# Patient Record
Sex: Male | Born: 1985
Health system: Southern US, Community
[De-identification: ages and names within clinical notes are randomized; demographics above are authoritative.]

---
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
## 2009-02-14 ENCOUNTER — Inpatient Hospital Stay (HOSPITAL_COMMUNITY): Admission: EM | Admit: 2009-02-14 | Discharge: 2009-02-17 | Payer: Self-pay | Admitting: Emergency Medicine

## 2010-06-29 LAB — BLOOD GAS, ARTERIAL
O2 Content: 4 L/min
O2 Saturation: 96.1 %
Patient temperature: 98.6
pH, Arterial: 7.408 (ref 7.350–7.450)

## 2010-06-29 LAB — DIFFERENTIAL
Basophils Absolute: 0 10*3/uL (ref 0.0–0.1)
Basophils Relative: 0 % (ref 0–1)
Eosinophils Absolute: 0.9 10*3/uL — ABNORMAL HIGH (ref 0.0–0.7)
Eosinophils Relative: 10 % — ABNORMAL HIGH (ref 0–5)
Lymphocytes Relative: 29 % (ref 12–46)
Lymphs Abs: 2.8 10*3/uL (ref 0.7–4.0)
Monocytes Absolute: 0.8 10*3/uL (ref 0.1–1.0)
Monocytes Relative: 9 % (ref 3–12)
Neutro Abs: 5.1 10*3/uL (ref 1.7–7.7)
Neutrophils Relative %: 53 % (ref 43–77)

## 2010-06-29 LAB — CBC
HCT: 48.4 % (ref 39.0–52.0)
Hemoglobin: 15.5 g/dL (ref 13.0–17.0)
Hemoglobin: 16.8 g/dL (ref 13.0–17.0)
MCHC: 34.1 g/dL (ref 30.0–36.0)
MCHC: 34.6 g/dL (ref 30.0–36.0)
MCV: 92.7 fL (ref 78.0–100.0)
Platelets: 227 10*3/uL (ref 150–400)
Platelets: 229 10*3/uL (ref 150–400)
RBC: 5.22 MIL/uL (ref 4.22–5.81)
RDW: 13.6 % (ref 11.5–15.5)
RDW: 13.8 % (ref 11.5–15.5)
WBC: 9.7 10*3/uL (ref 4.0–10.5)

## 2010-06-29 LAB — BASIC METABOLIC PANEL
BUN: 18 mg/dL (ref 6–23)
CO2: 24 mEq/L (ref 19–32)
Calcium: 9.5 mg/dL (ref 8.4–10.5)
Chloride: 104 mEq/L (ref 96–112)
Creatinine, Ser: 0.92 mg/dL (ref 0.4–1.5)
GFR calc Af Amer: >60 mL/min (ref >60)
GFR calc non Af Amer: >60 mL/min (ref >60)
Glucose, Bld: 82 mg/dL (ref 70–99)
Potassium: 3.9 mEq/L (ref 3.5–5.1)
Sodium: 139 mEq/L (ref 135–145)

## 2014-12-29 ENCOUNTER — Emergency Department (HOSPITAL_BASED_OUTPATIENT_CLINIC_OR_DEPARTMENT_OTHER)
Admission: EM | Admit: 2014-12-29 | Discharge: 2014-12-29 | Disposition: A | Discharge status: Home / Self Care | Payer: Worker's Compensation | Attending: Emergency Medicine | Admitting: Emergency Medicine

## 2014-12-29 ENCOUNTER — Encounter (HOSPITAL_BASED_OUTPATIENT_CLINIC_OR_DEPARTMENT_OTHER): Payer: Self-pay | Admitting: Emergency Medicine

## 2014-12-29 DIAGNOSIS — X58XXXA Exposure to other specified factors, initial encounter: Secondary | ICD-10-CM | POA: Insufficient documentation

## 2014-12-29 DIAGNOSIS — Y9389 Activity, other specified: Secondary | ICD-10-CM | POA: Diagnosis not present

## 2014-12-29 DIAGNOSIS — Y990 Civilian activity done for income or pay: Secondary | ICD-10-CM | POA: Diagnosis not present

## 2014-12-29 DIAGNOSIS — Y9289 Other specified places as the place of occurrence of the external cause: Secondary | ICD-10-CM | POA: Diagnosis not present

## 2014-12-29 DIAGNOSIS — T2601XA Burn of right eyelid and periocular area, initial encounter: Secondary | ICD-10-CM | POA: Insufficient documentation

## 2014-12-29 DIAGNOSIS — T2641XA Burn of right eye and adnexa, part unspecified, initial encounter: Secondary | ICD-10-CM

## 2014-12-29 MED ORDER — TETRACAINE HCL 0.5 % OP SOLN
1.0000 [drp] | Freq: Once | OPHTHALMIC | Status: AC
  Administered 2014-12-29: 1 [drp] via OPHTHALMIC

## 2014-12-29 MED ORDER — FLUORESCEIN SODIUM 1 MG OP STRP
ORAL_STRIP | OPHTHALMIC | Status: AC
  Administered 2014-12-29: 1 via OPHTHALMIC
  Filled 2014-12-29: qty 1

## 2014-12-29 MED ORDER — FLUORESCEIN SODIUM 1 MG OP STRP
1.0000 | ORAL_STRIP | Freq: Once | OPHTHALMIC | Status: AC
  Administered 2014-12-29: 1 via OPHTHALMIC

## 2014-12-29 MED ORDER — TETRACAINE HCL 0.5 % OP SOLN
OPHTHALMIC | Status: AC
  Administered 2014-12-29: 1 [drp] via OPHTHALMIC
  Filled 2014-12-29: qty 2

## 2014-12-29 NOTE — Discharge Instructions (Signed)

## 2014-12-29 NOTE — ED Provider Notes (Signed)
CSN: [id number]     Arrival date & time 12/29/14  2009 History  By signing my name below, I, Octavia Heir, attest that this documentation has been prepared under the direction and in the presence of Tilden Fossa, MD. Electronically Signed: Octavia Heir, ED Scribe. 12/29/2014. 9:12 PM.     Chief Complaint  Patient presents with  . Eye Injury      The history is provided by the patient. No language interpreter was used.   HPI Comments: Thomas Blevins is a 29 y.o. male who presents to the Emergency Department complaining of a sudden onset, constant, gradual worsening right eye injury that occurred 2 hours ago. 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 He notes the machine gets up to 6000 degrees. He notes washing his eye out with water after the incident occurred. Pt states he began to have a film over his eye as the hot solution was drying on his eye and he notes that his eye began to bleed. Pt reports putting some ointment on his eye to help with the pain. No visual changes.  History reviewed. No pertinent past medical history. History reviewed. No pertinent past surgical history. History reviewed. No pertinent family history. Social History  Substance Use Topics  . Smoking status: Never Smoker   . Smokeless tobacco: None  . Alcohol Use: Yes     Comment: occ    Review of Systems  Eyes: Positive for pain, discharge and redness.  All other systems reviewed and are negative.     Allergies  Review of patient's allergies indicates no known allergies.  Home Medications   Prior to Admission medications   Not on File   Triage vitals: BP 156/67 mmHg  Pulse 75  Temp(Src) 98.9 F (37.2 C) (Oral)  Resp 16  Ht  (1.854 m)  Wt 175 lb (79.379 kg)  BMI 23.09 kg/m2  SpO2 100% Physical Exam  Constitutional: He is oriented to person, place, and time. He appears well-developed and well-nourished.  HENT:  Head:  Normocephalic and atraumatic.  Eyes:  PERRL, EOMI.  Mild conjunctival injection on the right.  No uptake on fluorosceine staining of the right.  Symptomatic improvement in eye pain after tetracaine drops.  Right lateral periorbital region with superficial partial thickness burns on superior and inferior portions.    Cardiovascular: Normal rate.   Pulmonary/Chest: Effort normal. No respiratory distress.  Musculoskeletal: He exhibits no edema or tenderness.  Neurological: He is alert and oriented to person, place, and time.  Skin: Skin is warm and dry.  Psychiatric: He has a normal mood and affect. His behavior is normal.  Nursing note and vitals reviewed.   ED Course  Procedures  DIAGNOSTIC STUDIES: Oxygen Saturation is 100% on RA, normal by my interpretation.  COORDINATION OF CARE:  9:12 PM Discussed treatment plan which includes irrigate the right eye with pt at bedside and pt agreed to plan.  Labs Review Labs Reviewed - No data to display  Imaging Review No results found. I have personally reviewed and evaluated these images and lab results as part of my medical decision-making.   EKG Interpretation None      MDM   Final diagnoses:  Partial thickness burn of eye, right, initial encounter   Patient here for evaluation of injury following getting hit in the right eye with hot polyester thread from an extruding machine. He has a partial-thickness burn of the  periorbital region. There is no clear evidence of corneal injury. PH is 7 and I was irrigated in the emergency department. Pain improved after eyedrops. Discussed with patient ophthalmology follow-up for recheck tomorrow, home care for superficial partial-thickness burns with bacitracin ointment, return precautions.  I personally performed the services described in this documentation, which was scribed in my presence. The recorded information has been reviewed and is accurate.   Tilden Fossa, MD 12/30/14 [phone]

## 2014-12-29 NOTE — ED Notes (Signed)
Patient states that a chemical that he works with splashed into his right eye and he had some bleeding. Rinsed eyes well afterwards but would like to have it checked

## 2016-09-26 DIAGNOSIS — R5383 Other fatigue: Secondary | ICD-10-CM | POA: Diagnosis not present

## 2016-09-26 DIAGNOSIS — M791 Myalgia: Secondary | ICD-10-CM | POA: Diagnosis not present

## 2016-09-26 DIAGNOSIS — R42 Dizziness and giddiness: Secondary | ICD-10-CM | POA: Diagnosis not present

## 2016-09-26 DIAGNOSIS — R140 Abdominal distension (gaseous): Secondary | ICD-10-CM | POA: Diagnosis not present

## 2016-11-08 DIAGNOSIS — H5213 Myopia, bilateral: Secondary | ICD-10-CM | POA: Diagnosis not present

## 2017-06-18 DIAGNOSIS — Z113 Encounter for screening for infections with a predominantly sexual mode of transmission: Secondary | ICD-10-CM | POA: Diagnosis not present

## 2017-06-18 DIAGNOSIS — F419 Anxiety disorder, unspecified: Secondary | ICD-10-CM | POA: Diagnosis not present

## 2017-10-05 DIAGNOSIS — R748 Abnormal levels of other serum enzymes: Secondary | ICD-10-CM | POA: Diagnosis not present

## 2017-10-05 DIAGNOSIS — F419 Anxiety disorder, unspecified: Secondary | ICD-10-CM | POA: Diagnosis not present

## 2017-10-05 DIAGNOSIS — R5383 Other fatigue: Secondary | ICD-10-CM | POA: Diagnosis not present

## 2018-06-27 DIAGNOSIS — M549 Dorsalgia, unspecified: Secondary | ICD-10-CM | POA: Diagnosis not present

## 2018-06-27 DIAGNOSIS — Z0000 Encounter for general adult medical examination without abnormal findings: Secondary | ICD-10-CM | POA: Diagnosis not present

## 2018-06-27 DIAGNOSIS — F411 Generalized anxiety disorder: Secondary | ICD-10-CM | POA: Diagnosis not present

## 2018-06-27 DIAGNOSIS — Z8249 Family history of ischemic heart disease and other diseases of the circulatory system: Secondary | ICD-10-CM | POA: Diagnosis not present

## 2018-08-06 DIAGNOSIS — Z113 Encounter for screening for infections with a predominantly sexual mode of transmission: Secondary | ICD-10-CM | POA: Diagnosis not present

## 2018-08-20 DIAGNOSIS — J4541 Moderate persistent asthma with (acute) exacerbation: Secondary | ICD-10-CM | POA: Diagnosis not present

## 2018-08-20 DIAGNOSIS — Z20828 Contact with and (suspected) exposure to other viral communicable diseases: Secondary | ICD-10-CM | POA: Diagnosis not present

## 2018-09-05 DIAGNOSIS — J4521 Mild intermittent asthma with (acute) exacerbation: Secondary | ICD-10-CM | POA: Diagnosis not present

## 2018-10-25 ENCOUNTER — Ambulatory Visit
Admission: RE | Admit: 2018-10-25 | Discharge: 2018-10-25 | Disposition: A | Discharge status: Home / Self Care | Payer: Self-pay | Source: Ambulatory Visit | Attending: Family Medicine | Admitting: Family Medicine

## 2018-10-25 ENCOUNTER — Other Ambulatory Visit: Payer: Self-pay

## 2018-10-25 ENCOUNTER — Other Ambulatory Visit: Payer: Self-pay | Admitting: Family Medicine

## 2018-10-25 DIAGNOSIS — R0789 Other chest pain: Secondary | ICD-10-CM | POA: Diagnosis not present

## 2018-10-25 DIAGNOSIS — R091 Pleurisy: Secondary | ICD-10-CM

## 2019-02-17 DIAGNOSIS — Z20828 Contact with and (suspected) exposure to other viral communicable diseases: Secondary | ICD-10-CM | POA: Diagnosis not present

## 2019-02-17 DIAGNOSIS — Z9189 Other specified personal risk factors, not elsewhere classified: Secondary | ICD-10-CM | POA: Diagnosis not present

## 2019-06-30 DIAGNOSIS — Z0000 Encounter for general adult medical examination without abnormal findings: Secondary | ICD-10-CM | POA: Diagnosis not present

## 2019-06-30 DIAGNOSIS — N410 Acute prostatitis: Secondary | ICD-10-CM | POA: Diagnosis not present

## 2019-06-30 DIAGNOSIS — J4520 Mild intermittent asthma, uncomplicated: Secondary | ICD-10-CM | POA: Diagnosis not present

## 2019-06-30 DIAGNOSIS — Z83438 Family history of other disorder of lipoprotein metabolism and other lipidemia: Secondary | ICD-10-CM | POA: Diagnosis not present

## 2020-01-05 DIAGNOSIS — J4521 Mild intermittent asthma with (acute) exacerbation: Secondary | ICD-10-CM | POA: Diagnosis not present

## 2020-01-05 DIAGNOSIS — U071 COVID-19: Secondary | ICD-10-CM | POA: Diagnosis not present

## 2020-02-26 DIAGNOSIS — M4726 Other spondylosis with radiculopathy, lumbar region: Secondary | ICD-10-CM | POA: Diagnosis not present

## 2020-02-26 DIAGNOSIS — M5431 Sciatica, right side: Secondary | ICD-10-CM | POA: Diagnosis not present

## 2020-02-26 DIAGNOSIS — M545 Low back pain, unspecified: Secondary | ICD-10-CM | POA: Diagnosis not present

## 2020-03-17 DIAGNOSIS — Z113 Encounter for screening for infections with a predominantly sexual mode of transmission: Secondary | ICD-10-CM | POA: Diagnosis not present

## 2020-03-17 DIAGNOSIS — N5089 Other specified disorders of the male genital organs: Secondary | ICD-10-CM | POA: Diagnosis not present

## 2020-06-30 DIAGNOSIS — F411 Generalized anxiety disorder: Secondary | ICD-10-CM | POA: Diagnosis not present

## 2020-06-30 DIAGNOSIS — Z0000 Encounter for general adult medical examination without abnormal findings: Secondary | ICD-10-CM | POA: Diagnosis not present

## 2020-06-30 DIAGNOSIS — R5383 Other fatigue: Secondary | ICD-10-CM | POA: Diagnosis not present

## 2020-09-24 DIAGNOSIS — R6882 Decreased libido: Secondary | ICD-10-CM | POA: Diagnosis not present

## 2020-11-23 DIAGNOSIS — M79671 Pain in right foot: Secondary | ICD-10-CM | POA: Diagnosis not present

## 2020-11-23 DIAGNOSIS — S91321A Laceration with foreign body, right foot, initial encounter: Secondary | ICD-10-CM | POA: Diagnosis not present

## 2020-11-23 DIAGNOSIS — Y92832 Beach as the place of occurrence of the external cause: Secondary | ICD-10-CM | POA: Diagnosis not present

## 2020-11-23 DIAGNOSIS — W228XXA Striking against or struck by other objects, initial encounter: Secondary | ICD-10-CM | POA: Diagnosis not present

## 2020-12-28 ENCOUNTER — Other Ambulatory Visit: Payer: Self-pay

## 2020-12-28 ENCOUNTER — Ambulatory Visit (INDEPENDENT_AMBULATORY_CARE_PROVIDER_SITE_OTHER): Payer: BC Managed Care – PPO | Admitting: Endocrinology

## 2020-12-28 DIAGNOSIS — R5383 Other fatigue: Secondary | ICD-10-CM | POA: Diagnosis not present

## 2020-12-28 LAB — T3, FREE: T3, Free: 3.4 pg/mL (ref 2.3–4.2)

## 2020-12-28 LAB — CORTISOL: Cortisol, Plasma: 13 ug/dL

## 2020-12-28 NOTE — Progress Notes (Signed)
Subjective:    Patient ID: Thomas Blevins, male    DOB: 05/21/1985, 35 y.o.   MRN: [id number]  HPI Pt is referred by Dr Tiburcio Pea, for several sxs.  He reports 3 years of fatigue, low libido, dry skin, and difficulty with concentration.  Sxs are worse sine covid infection earlier in 2022.   no h/o abdominal or brain injury.  No h/o cancer, thyroid problems, seizures, hypoglycemia, amyloidosis, tuberculosis, or diabetes.  No recent steroids.  No h/o ketoconazole, rifampin, or dilantin.  He has never had adrenal imaging.  We have run out of diluent for cosyntropin.  Pt reports he had puberty at the normal age.  He has no biological children.  He says he has never taken illicit androgens.  He has never had pituitary imaging. He has never been on any prescribed medication for hypogonadism.  He does not take antiandrogens or opioids.  He denies any h/o infertility, XRT.Marland Kitchen  He has never had surgery, or a serious injury to the head or genital area. He has no h/o or DVT.   He does not consume alcohol excessively.    Social History   Socioeconomic History   Marital status: Married    Spouse name: Not on file   Number of children: Not on file   Years of education: Not on file   Highest education level: Not on file  Occupational History   Not on file  Tobacco Use   Smoking status: Never   Smokeless tobacco: Not on file  Substance and Sexual Activity   Alcohol use: Yes    Comment: occ   Drug use: No   Sexual activity: Not on file  Other Topics Concern   Not on file  Social History Narrative   Not on file   Social Determinants of Health   Financial Resource Strain: Not on file  Food Insecurity: Not on file  Transportation Needs: Not on file  Physical Activity: Not on file  Stress: Not on file  Social Connections: Not on file  Intimate Partner Violence: Not on file    No current outpatient medications on file prior to visit.   No current facility-administered medications on file  prior to visit.    No Known Allergies  No family history on file.  BP 130/70 (BP Location: Right Arm, Patient Position: Sitting, Cuff Size: Large)   Pulse 72   Ht 6\' 1"  (1.854 m)   Wt 201 lb 3.2 oz (91.3 kg)   SpO2 98%   BMI 26.55 kg/m    Review of Systems Denies weight loss, dizziness, n/v, cold intolerance, vitiligo, and change in skin tone.      Objective:   Physical Exam VS: see vs page GEN: no distress HEAD: head: no deformity eyes: no periorbital swelling, no proptosis external nose and ears are normal NECK: supple, thyroid is not enlarged CHEST WALL: no deformity LUNGS: clear to auscultation BREASTS:  No gynecomastia CV: reg rate and rhythm, soft syst murmur (pt says chronic) GENITALIA:  Normal male.   MUSCULOSKELETAL: muscle bulk and strength are grossly normal.  no joint swelling is seen  gait is normal and steady.  EXTEMITIES: no leg edema NEURO: sensation is intact to touch on all 4's SKIN:  Normal texture and temperature.  No rash or suspicious lesion is visible.  Normal male hair distribution. NODES:  None palpable at the neck PSYCH: alert, well-oriented.  Does not appear anxious nor depressed.    outside test results are reviewed: Testosterone=427  TSH=1.4 Cortisol=17  I have reviewed outside records, and summarized: Pt was noted to have fatigue and other sxs, and referred here.  Seborrhea, asthma, and anxiety were also addressed     Assessment & Plan:  Fatigue and other sxs, new to me, uncertain etiology and prognosis.  I told pt no medication is needed for testosterone  Patient Instructions  Blood tests are requested for you today.  We'll let you know about the results.

## 2020-12-28 NOTE — Patient Instructions (Signed)
Blood tests are requested for you today.  We'll let you know about the results.  

## 2020-12-29 LAB — VITAMIN B12: Vitamin B-12: 546 pg/mL (ref 211–911)

## 2020-12-29 LAB — T4, FREE: Free T4: 0.87 ng/dL (ref 0.60–1.60)

## 2021-01-02 LAB — CATECHOLAMINES, FRACTIONATED, PLASMA
Dopamine: <10 pg/mL
Epinephrine: 38 pg/mL
Norepinephrine: 333 pg/mL
Total Catecholamines: 371 pg/mL

## 2021-01-02 LAB — ACTH: C206 ACTH: 42 pg/mL (ref 6–50)

## 2021-01-02 LAB — METANEPHRINES, PLASMA
Metanephrine, Free: 34 pg/mL (ref <=57)
Normetanephrine, Free: 64 pg/mL (ref <=148)
Total Metanephrines-Plasma: 98 pg/mL (ref <=205)

## 2021-03-30 DIAGNOSIS — E291 Testicular hypofunction: Secondary | ICD-10-CM | POA: Diagnosis not present

## 2021-07-11 DIAGNOSIS — Z0000 Encounter for general adult medical examination without abnormal findings: Secondary | ICD-10-CM | POA: Diagnosis not present

## 2021-07-11 DIAGNOSIS — Z23 Encounter for immunization: Secondary | ICD-10-CM | POA: Diagnosis not present

## 2021-07-11 DIAGNOSIS — R0789 Other chest pain: Secondary | ICD-10-CM | POA: Diagnosis not present

## 2021-07-29 DIAGNOSIS — J4521 Mild intermittent asthma with (acute) exacerbation: Secondary | ICD-10-CM | POA: Diagnosis not present

## 2021-08-18 ENCOUNTER — Ambulatory Visit
Admission: RE | Admit: 2021-08-18 | Discharge: 2021-08-18 | Disposition: A | Discharge status: Home / Self Care | Payer: BC Managed Care – PPO | Source: Ambulatory Visit | Attending: Family Medicine | Admitting: Family Medicine

## 2021-08-18 ENCOUNTER — Other Ambulatory Visit: Payer: Self-pay | Admitting: Family Medicine

## 2021-08-18 DIAGNOSIS — R079 Chest pain, unspecified: Secondary | ICD-10-CM | POA: Diagnosis not present

## 2021-08-18 DIAGNOSIS — R0789 Other chest pain: Secondary | ICD-10-CM

## 2022-03-09 DIAGNOSIS — J4521 Mild intermittent asthma with (acute) exacerbation: Secondary | ICD-10-CM | POA: Diagnosis not present

## 2022-03-16 ENCOUNTER — Ambulatory Visit: Payer: Self-pay | Admitting: Podiatry

## 2022-03-16 DIAGNOSIS — M778 Other enthesopathies, not elsewhere classified: Secondary | ICD-10-CM

## 2022-04-27 ENCOUNTER — Ambulatory Visit: Payer: Self-pay | Admitting: Podiatry

## 2022-05-02 DIAGNOSIS — J45901 Unspecified asthma with (acute) exacerbation: Secondary | ICD-10-CM | POA: Diagnosis not present

## 2022-05-02 DIAGNOSIS — G4700 Insomnia, unspecified: Secondary | ICD-10-CM | POA: Diagnosis not present

## 2022-05-02 DIAGNOSIS — Z113 Encounter for screening for infections with a predominantly sexual mode of transmission: Secondary | ICD-10-CM | POA: Diagnosis not present

## 2022-05-05 DIAGNOSIS — F4321 Adjustment disorder with depressed mood: Secondary | ICD-10-CM | POA: Diagnosis not present

## 2022-06-06 DIAGNOSIS — F4321 Adjustment disorder with depressed mood: Secondary | ICD-10-CM | POA: Diagnosis not present

## 2022-06-20 DIAGNOSIS — R059 Cough, unspecified: Secondary | ICD-10-CM | POA: Diagnosis not present

## 2022-06-20 DIAGNOSIS — R0602 Shortness of breath: Secondary | ICD-10-CM | POA: Diagnosis not present

## 2022-07-04 DIAGNOSIS — F4321 Adjustment disorder with depressed mood: Secondary | ICD-10-CM | POA: Diagnosis not present

## 2022-07-14 DIAGNOSIS — F419 Anxiety disorder, unspecified: Secondary | ICD-10-CM | POA: Diagnosis not present

## 2022-07-14 DIAGNOSIS — J4520 Mild intermittent asthma, uncomplicated: Secondary | ICD-10-CM | POA: Diagnosis not present

## 2022-07-14 DIAGNOSIS — Z0000 Encounter for general adult medical examination without abnormal findings: Secondary | ICD-10-CM | POA: Diagnosis not present

## 2022-07-14 DIAGNOSIS — Z13220 Encounter for screening for lipoid disorders: Secondary | ICD-10-CM | POA: Diagnosis not present

## 2022-07-14 DIAGNOSIS — F5101 Primary insomnia: Secondary | ICD-10-CM | POA: Diagnosis not present

## 2022-07-24 DIAGNOSIS — F4321 Adjustment disorder with depressed mood: Secondary | ICD-10-CM | POA: Diagnosis not present

## 2022-08-25 DIAGNOSIS — R109 Unspecified abdominal pain: Secondary | ICD-10-CM | POA: Diagnosis not present

## 2022-08-25 DIAGNOSIS — R197 Diarrhea, unspecified: Secondary | ICD-10-CM | POA: Diagnosis not present

## 2022-10-03 DIAGNOSIS — J4521 Mild intermittent asthma with (acute) exacerbation: Secondary | ICD-10-CM | POA: Diagnosis not present

## 2022-10-03 DIAGNOSIS — B079 Viral wart, unspecified: Secondary | ICD-10-CM | POA: Diagnosis not present

## 2022-10-18 IMAGING — CR DG CHEST 2V
3 series · 3 of 3 positions shown · non-contrast
Comparison: Chest x-ray 10/25/2018

CLINICAL DATA: Chest pain

EXAM:
CHEST - 2 VIEW

[w chest pa]
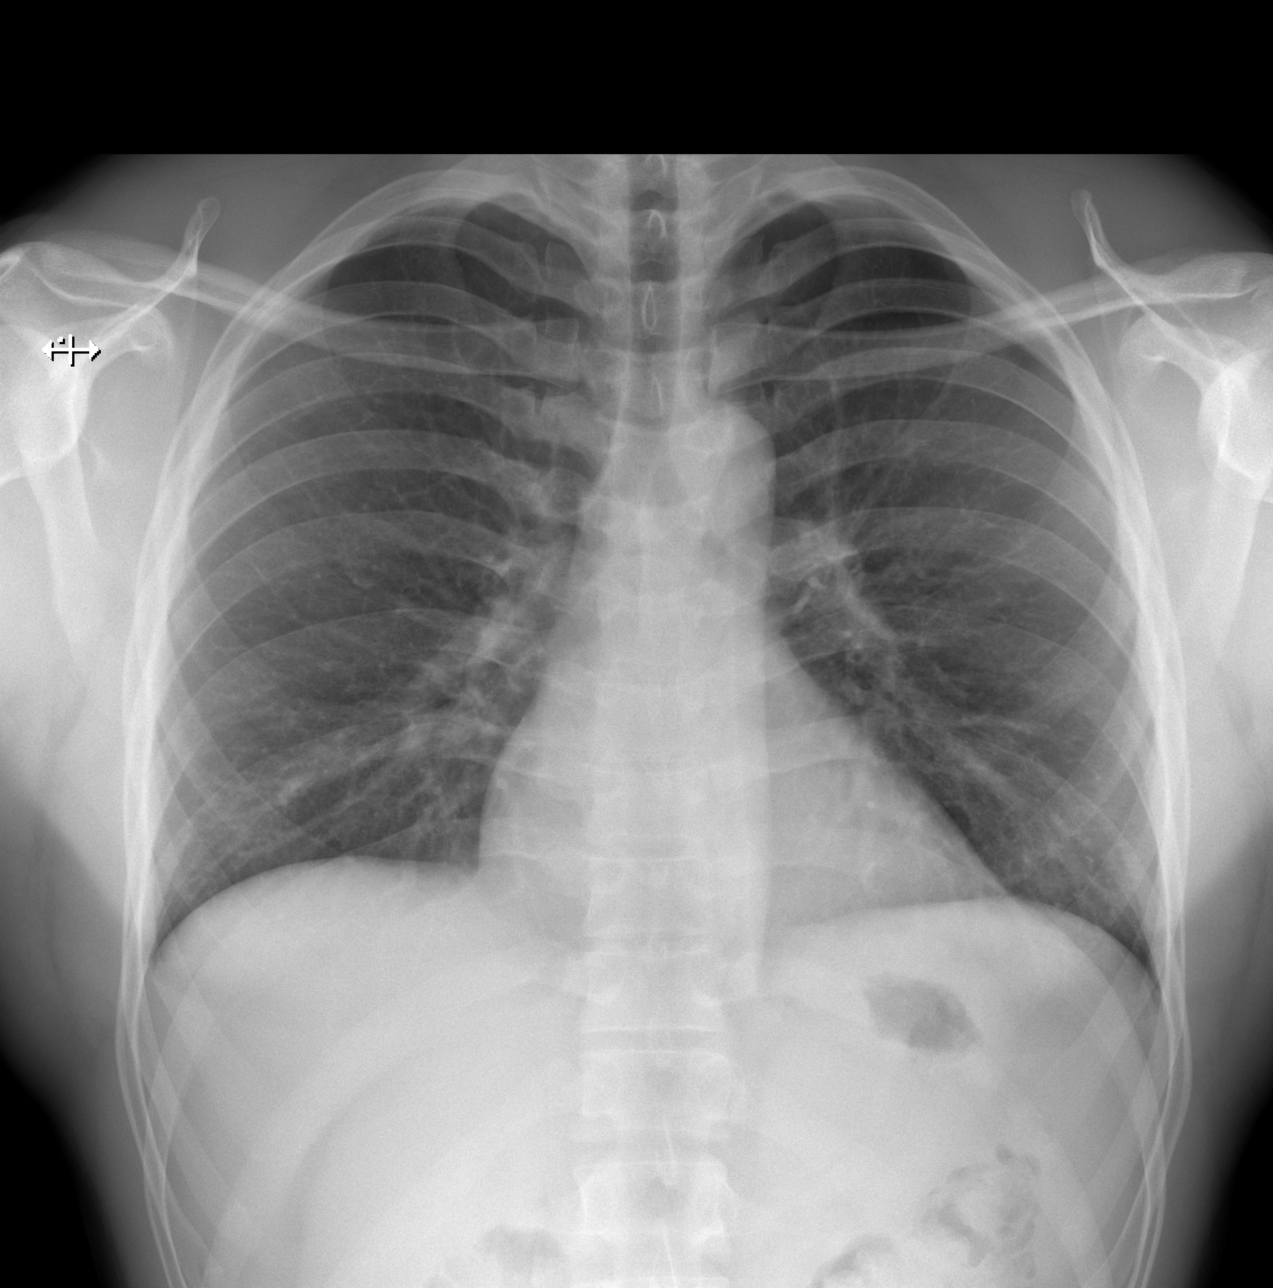

[w chest lat (1 of 2)]
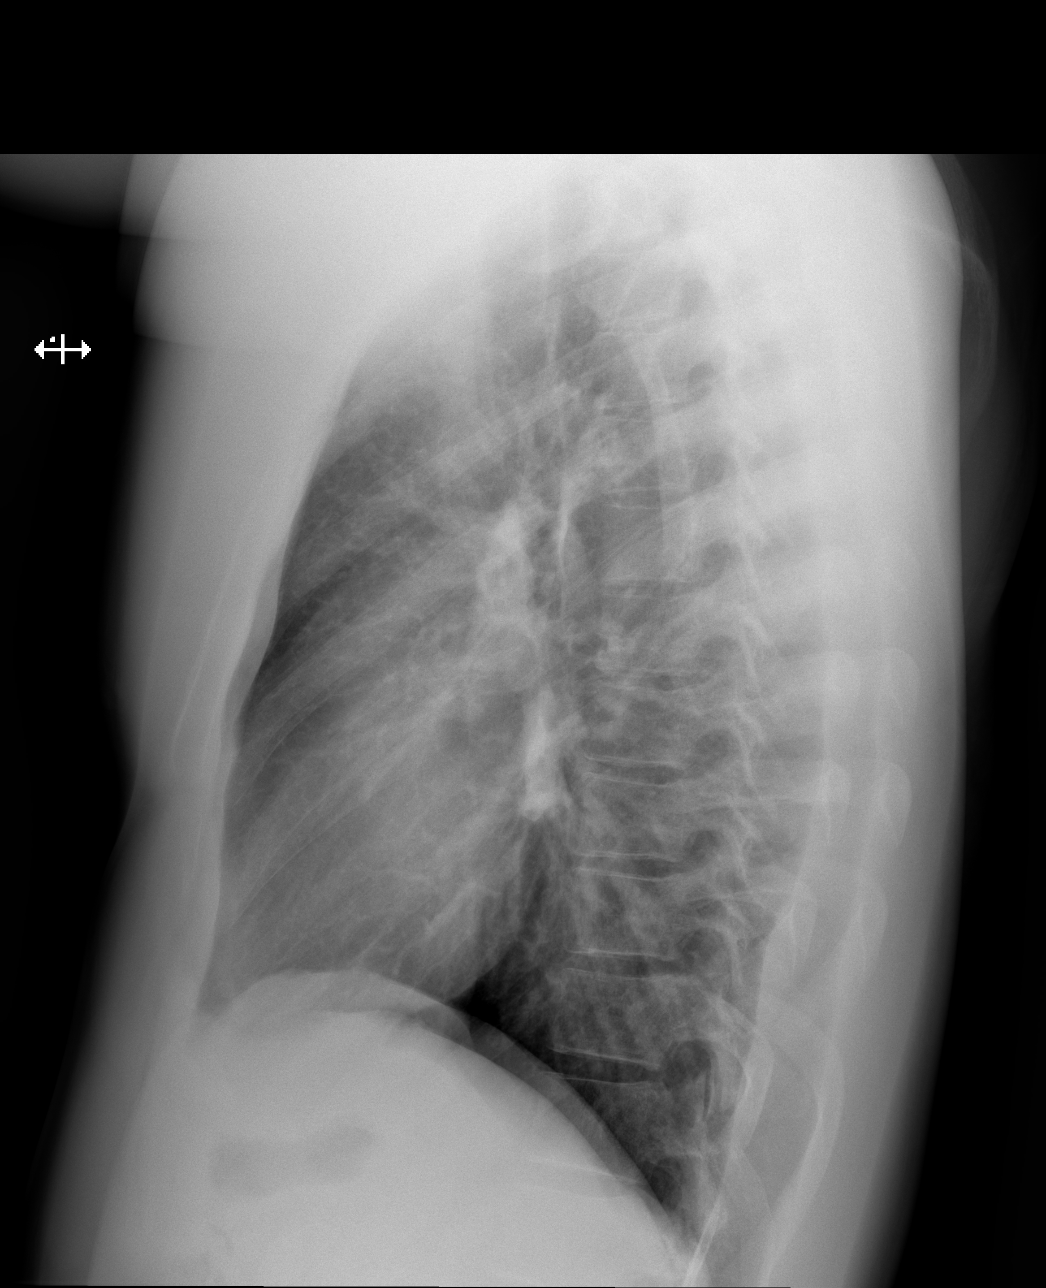

[w chest lat (2 of 2)]
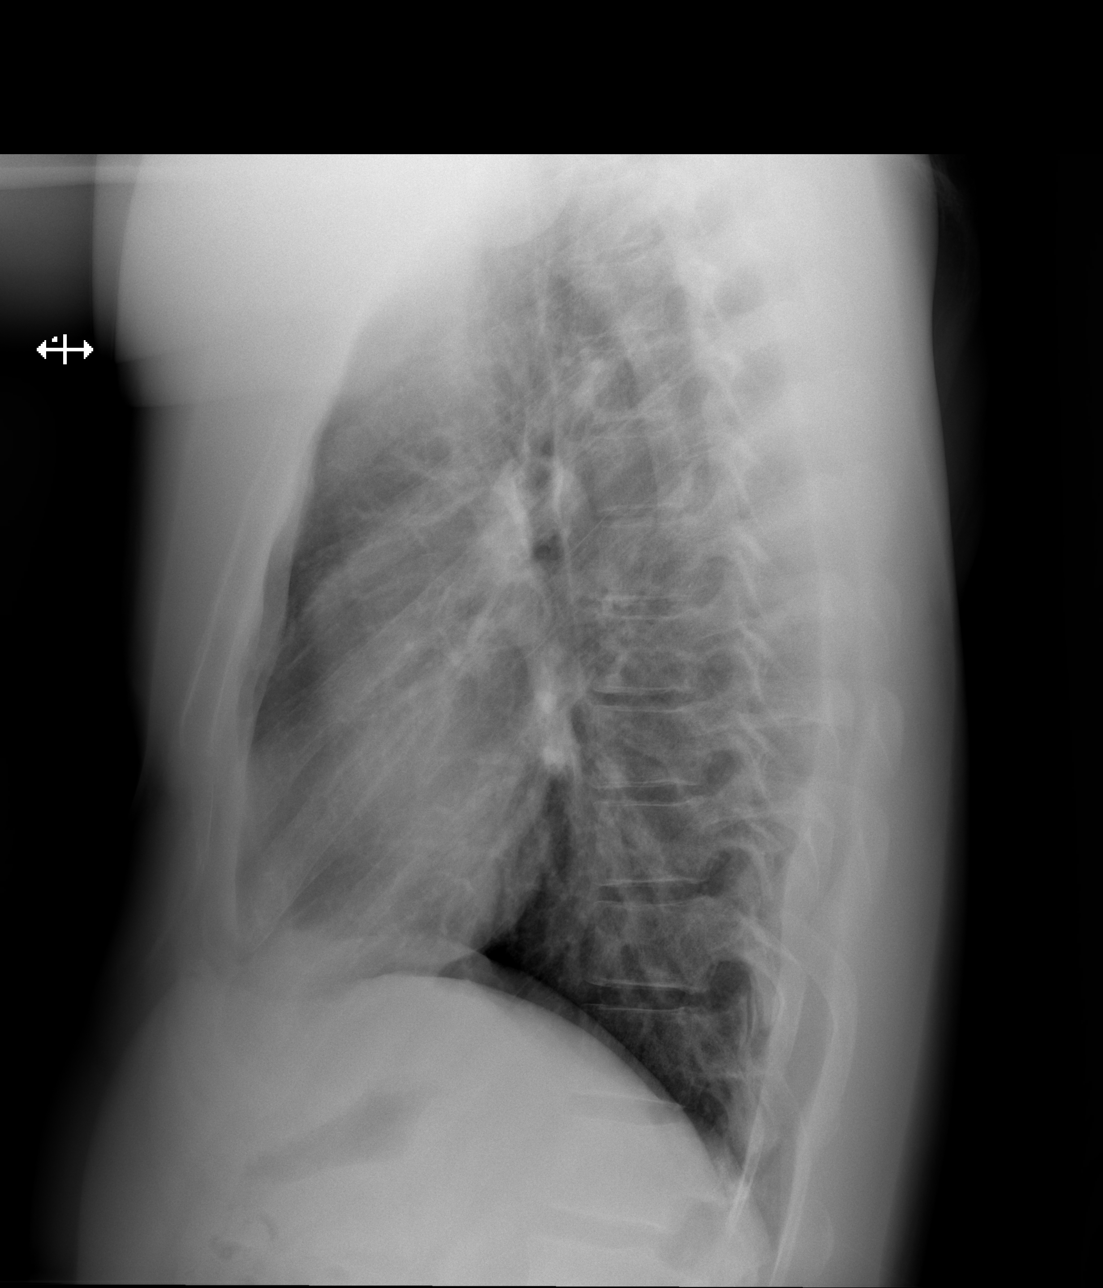

[3 of 3 positions shown; findings below may reference images not displayed]

FINDINGS: Heart size and mediastinal contours are within normal limits. No
suspicious pulmonary opacities identified.

No pleural effusion or pneumothorax visualized.

No acute osseous abnormality appreciated.
IMPRESSION: No acute intrathoracic process identified.

## 2022-12-11 DIAGNOSIS — J4521 Mild intermittent asthma with (acute) exacerbation: Secondary | ICD-10-CM | POA: Diagnosis not present

## 2023-01-24 DIAGNOSIS — Z840 Family history of diseases of the skin and subcutaneous tissue: Secondary | ICD-10-CM | POA: Diagnosis not present

## 2023-01-24 DIAGNOSIS — R519 Headache, unspecified: Secondary | ICD-10-CM | POA: Diagnosis not present

## 2023-01-24 DIAGNOSIS — F411 Generalized anxiety disorder: Secondary | ICD-10-CM | POA: Diagnosis not present

## 2023-01-24 DIAGNOSIS — H539 Unspecified visual disturbance: Secondary | ICD-10-CM | POA: Diagnosis not present

## 2023-01-29 ENCOUNTER — Other Ambulatory Visit: Payer: Self-pay | Admitting: Family Medicine

## 2023-01-29 DIAGNOSIS — R4189 Other symptoms and signs involving cognitive functions and awareness: Secondary | ICD-10-CM

## 2023-01-29 DIAGNOSIS — H539 Unspecified visual disturbance: Secondary | ICD-10-CM

## 2023-01-29 DIAGNOSIS — R519 Headache, unspecified: Secondary | ICD-10-CM

## 2023-02-08 DIAGNOSIS — Z23 Encounter for immunization: Secondary | ICD-10-CM | POA: Diagnosis not present

## 2023-02-12 ENCOUNTER — Encounter: Payer: Self-pay | Admitting: Family Medicine

## 2023-02-17 ENCOUNTER — Ambulatory Visit
Admission: RE | Admit: 2023-02-17 | Discharge: 2023-02-17 | Disposition: A | Discharge status: Home / Self Care | Payer: BC Managed Care – PPO | Source: Ambulatory Visit | Attending: Family Medicine | Admitting: Family Medicine

## 2023-02-17 DIAGNOSIS — R4701 Aphasia: Secondary | ICD-10-CM | POA: Diagnosis not present

## 2023-02-17 DIAGNOSIS — R41840 Attention and concentration deficit: Secondary | ICD-10-CM | POA: Diagnosis not present

## 2023-02-17 DIAGNOSIS — R519 Headache, unspecified: Secondary | ICD-10-CM | POA: Diagnosis not present

## 2023-02-17 DIAGNOSIS — H539 Unspecified visual disturbance: Secondary | ICD-10-CM

## 2023-02-17 DIAGNOSIS — R5383 Other fatigue: Secondary | ICD-10-CM | POA: Diagnosis not present

## 2023-02-17 DIAGNOSIS — R4189 Other symptoms and signs involving cognitive functions and awareness: Secondary | ICD-10-CM

## 2023-06-11 DIAGNOSIS — M79641 Pain in right hand: Secondary | ICD-10-CM | POA: Diagnosis not present

## 2023-06-11 DIAGNOSIS — S62231A Other displaced fracture of base of first metacarpal bone, right hand, initial encounter for closed fracture: Secondary | ICD-10-CM | POA: Diagnosis not present

## 2023-06-11 DIAGNOSIS — S63637A Sprain of interphalangeal joint of left little finger, initial encounter: Secondary | ICD-10-CM | POA: Diagnosis not present

## 2023-06-11 DIAGNOSIS — M79645 Pain in left finger(s): Secondary | ICD-10-CM | POA: Diagnosis not present

## 2023-06-12 DIAGNOSIS — S62231A Other displaced fracture of base of first metacarpal bone, right hand, initial encounter for closed fracture: Secondary | ICD-10-CM | POA: Diagnosis not present

## 2023-06-22 DIAGNOSIS — S62231D Other displaced fracture of base of first metacarpal bone, right hand, subsequent encounter for fracture with routine healing: Secondary | ICD-10-CM | POA: Diagnosis not present

## 2023-07-06 DIAGNOSIS — S62231D Other displaced fracture of base of first metacarpal bone, right hand, subsequent encounter for fracture with routine healing: Secondary | ICD-10-CM | POA: Diagnosis not present

## 2023-07-20 DIAGNOSIS — Z0000 Encounter for general adult medical examination without abnormal findings: Secondary | ICD-10-CM | POA: Diagnosis not present

## 2023-07-20 DIAGNOSIS — R739 Hyperglycemia, unspecified: Secondary | ICD-10-CM | POA: Diagnosis not present

## 2023-07-20 DIAGNOSIS — J4520 Mild intermittent asthma, uncomplicated: Secondary | ICD-10-CM | POA: Diagnosis not present

## 2023-07-20 DIAGNOSIS — R5383 Other fatigue: Secondary | ICD-10-CM | POA: Diagnosis not present

## 2023-07-24 DIAGNOSIS — S62231D Other displaced fracture of base of first metacarpal bone, right hand, subsequent encounter for fracture with routine healing: Secondary | ICD-10-CM | POA: Diagnosis not present

## 2023-08-14 DIAGNOSIS — S62231D Other displaced fracture of base of first metacarpal bone, right hand, subsequent encounter for fracture with routine healing: Secondary | ICD-10-CM | POA: Diagnosis not present

## 2023-09-05 DIAGNOSIS — R945 Abnormal results of liver function studies: Secondary | ICD-10-CM | POA: Diagnosis not present

## 2023-09-05 DIAGNOSIS — E291 Testicular hypofunction: Secondary | ICD-10-CM | POA: Diagnosis not present

## 2023-12-19 DIAGNOSIS — R5383 Other fatigue: Secondary | ICD-10-CM | POA: Diagnosis not present

## 2023-12-19 DIAGNOSIS — Z0000 Encounter for general adult medical examination without abnormal findings: Secondary | ICD-10-CM | POA: Diagnosis not present

## 2023-12-26 DIAGNOSIS — N760 Acute vaginitis: Secondary | ICD-10-CM | POA: Diagnosis not present

## 2023-12-26 DIAGNOSIS — Z113 Encounter for screening for infections with a predominantly sexual mode of transmission: Secondary | ICD-10-CM | POA: Diagnosis not present

## 2023-12-26 DIAGNOSIS — F411 Generalized anxiety disorder: Secondary | ICD-10-CM | POA: Diagnosis not present
